# Patient Record
Sex: Female | Born: 1985 | Hispanic: No | Marital: Married | Smoking: Never smoker
Health system: Southern US, Community
[De-identification: ages and names within clinical notes are randomized; demographics above are authoritative.]

## PROBLEM LIST (undated history)

## (undated) HISTORY — PX: DILATION AND CURETTAGE OF UTERUS: SHX78

## (undated) HISTORY — PX: WISDOM TOOTH EXTRACTION: SHX21

---

## 2020-02-07 LAB — OB RESULTS CONSOLE RUBELLA ANTIBODY, IGM: Rubella: IMMUNE

## 2020-02-07 LAB — OB RESULTS CONSOLE HIV ANTIBODY (ROUTINE TESTING): HIV: NONREACTIVE

## 2020-02-07 LAB — HEPATITIS C ANTIBODY: HCV Ab: NEGATIVE

## 2020-02-07 LAB — OB RESULTS CONSOLE HEPATITIS B SURFACE ANTIGEN: Hepatitis B Surface Ag: NEGATIVE

## 2020-02-07 LAB — OB RESULTS CONSOLE RPR: RPR: NONREACTIVE

## 2020-02-15 ENCOUNTER — Inpatient Hospital Stay (HOSPITAL_COMMUNITY): Admit: 2020-02-15 | Payer: 59 | Admitting: Obstetrics & Gynecology

## 2020-03-03 ENCOUNTER — Other Ambulatory Visit: Payer: 59

## 2020-03-03 DIAGNOSIS — Z20822 Contact with and (suspected) exposure to covid-19: Secondary | ICD-10-CM

## 2020-03-05 LAB — SARS-COV-2, NAA 2 DAY TAT

## 2020-03-05 LAB — NOVEL CORONAVIRUS, NAA: SARS-CoV-2, NAA: NOT DETECTED

## 2020-04-24 ENCOUNTER — Other Ambulatory Visit: Payer: Self-pay

## 2020-04-24 ENCOUNTER — Other Ambulatory Visit: Payer: 59

## 2020-04-24 DIAGNOSIS — Z20822 Contact with and (suspected) exposure to covid-19: Secondary | ICD-10-CM

## 2020-04-24 NOTE — Addendum Note (Signed)
Addended by: Tacy Dura on: 04/24/2020 06:50 PM   Modules accepted: Orders

## 2020-04-26 LAB — NOVEL CORONAVIRUS, NAA: SARS-CoV-2, NAA: DETECTED — AB

## 2020-04-26 LAB — SARS-COV-2, NAA 2 DAY TAT

## 2020-08-22 LAB — OB RESULTS CONSOLE GBS: GBS: NEGATIVE

## 2020-09-12 ENCOUNTER — Telehealth (HOSPITAL_COMMUNITY): Payer: Self-pay | Admitting: *Deleted

## 2020-09-12 ENCOUNTER — Encounter (HOSPITAL_COMMUNITY): Payer: Self-pay | Admitting: *Deleted

## 2020-09-12 NOTE — Telephone Encounter (Signed)
Preadmission screen  

## 2020-09-13 ENCOUNTER — Other Ambulatory Visit: Payer: Self-pay | Admitting: Obstetrics and Gynecology

## 2020-09-13 ENCOUNTER — Other Ambulatory Visit (HOSPITAL_COMMUNITY)
Admission: RE | Admit: 2020-09-13 | Discharge: 2020-09-13 | Disposition: A | Discharge status: Home / Self Care | Payer: 59 | Source: Ambulatory Visit | Attending: Obstetrics and Gynecology | Admitting: Obstetrics and Gynecology

## 2020-09-13 DIAGNOSIS — Z01812 Encounter for preprocedural laboratory examination: Secondary | ICD-10-CM | POA: Insufficient documentation

## 2020-09-13 DIAGNOSIS — Z20822 Contact with and (suspected) exposure to covid-19: Secondary | ICD-10-CM | POA: Insufficient documentation

## 2020-09-13 LAB — SARS CORONAVIRUS 2 (TAT 6-24 HRS): SARS Coronavirus 2: NEGATIVE

## 2020-09-14 ENCOUNTER — Inpatient Hospital Stay (HOSPITAL_COMMUNITY): Payer: 59 | Admitting: Anesthesiology

## 2020-09-14 ENCOUNTER — Inpatient Hospital Stay (HOSPITAL_COMMUNITY)
Admission: AD | Admit: 2020-09-14 | Discharge: 2020-09-17 | DRG: 788 | Disposition: A | Discharge status: Home / Self Care | Payer: 59 | Attending: Obstetrics and Gynecology | Admitting: Obstetrics and Gynecology

## 2020-09-14 ENCOUNTER — Inpatient Hospital Stay (HOSPITAL_COMMUNITY): Payer: 59

## 2020-09-14 ENCOUNTER — Inpatient Hospital Stay (HOSPITAL_BASED_OUTPATIENT_CLINIC_OR_DEPARTMENT_OTHER): Payer: 59

## 2020-09-14 ENCOUNTER — Encounter (HOSPITAL_COMMUNITY): Payer: Self-pay | Admitting: Obstetrics and Gynecology

## 2020-09-14 ENCOUNTER — Other Ambulatory Visit: Payer: Self-pay

## 2020-09-14 DIAGNOSIS — Z3A39 39 weeks gestation of pregnancy: Secondary | ICD-10-CM

## 2020-09-14 DIAGNOSIS — O329XX0 Maternal care for malpresentation of fetus, unspecified, not applicable or unspecified: Secondary | ICD-10-CM | POA: Diagnosis not present

## 2020-09-14 DIAGNOSIS — O320XX0 Maternal care for unstable lie, not applicable or unspecified: Secondary | ICD-10-CM | POA: Diagnosis present

## 2020-09-14 DIAGNOSIS — Z20822 Contact with and (suspected) exposure to covid-19: Secondary | ICD-10-CM | POA: Diagnosis present

## 2020-09-14 DIAGNOSIS — O321XX0 Maternal care for breech presentation, not applicable or unspecified: Secondary | ICD-10-CM | POA: Diagnosis present

## 2020-09-14 DIAGNOSIS — Z3689 Encounter for other specified antenatal screening: Secondary | ICD-10-CM | POA: Diagnosis not present

## 2020-09-14 DIAGNOSIS — Z3490 Encounter for supervision of normal pregnancy, unspecified, unspecified trimester: Secondary | ICD-10-CM

## 2020-09-14 LAB — CBC
HCT: 37.4 % (ref 36.0–46.0)
Hemoglobin: 12.6 g/dL (ref 12.0–15.0)
MCH: 30.1 pg (ref 26.0–34.0)
MCHC: 33.7 g/dL (ref 30.0–36.0)
MCV: 89.5 fL (ref 80.0–100.0)
Platelets: 155 10*3/uL (ref 150–400)
RBC: 4.18 MIL/uL (ref 3.87–5.11)
RDW: 14.9 % (ref 11.5–15.5)
WBC: 6.3 10*3/uL (ref 4.0–10.5)
nRBC: 0 % (ref 0.0–0.2)

## 2020-09-14 LAB — RPR: RPR Ser Ql: NONREACTIVE

## 2020-09-14 LAB — TYPE AND SCREEN
ABO/RH(D): O POS
Antibody Screen: NEGATIVE

## 2020-09-14 IMAGING — US US MFM OB LIMITED
1 series · 15 of 19 positions shown · non-contrast
Comparison: none

[Series 1: us mfm ob limited · 15 of 19 slices shown]
[im 1/19]
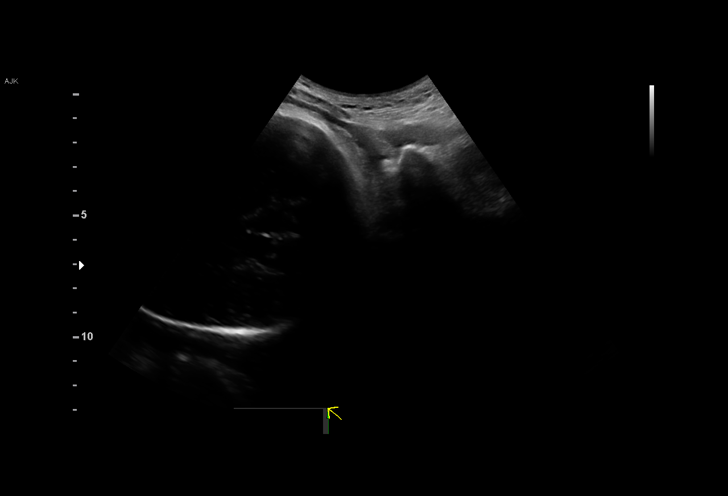
[im 2/19]
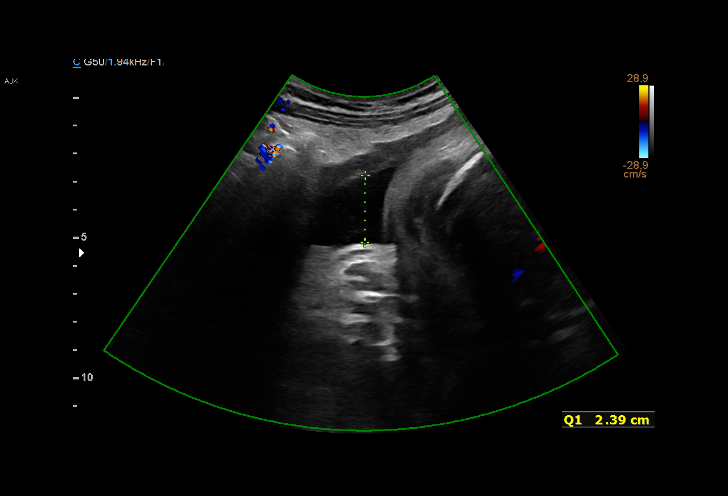
[im 4/19]
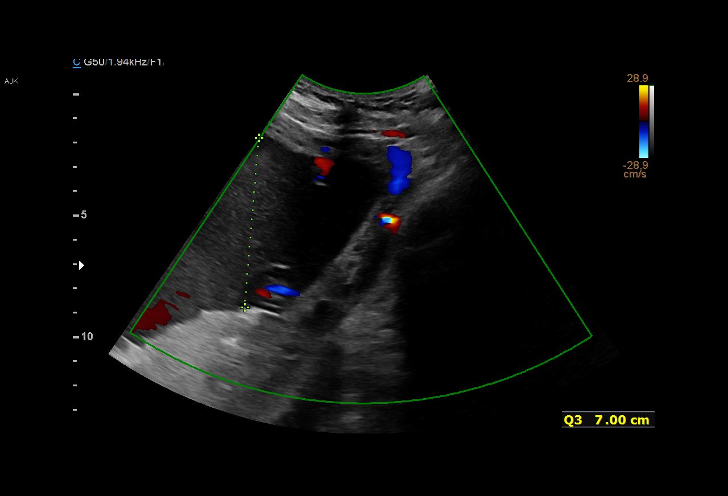
[im 5/19]
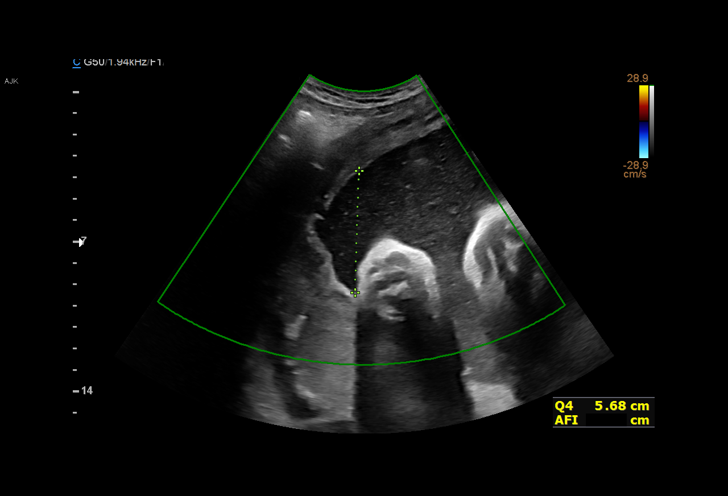
[im 6/19]
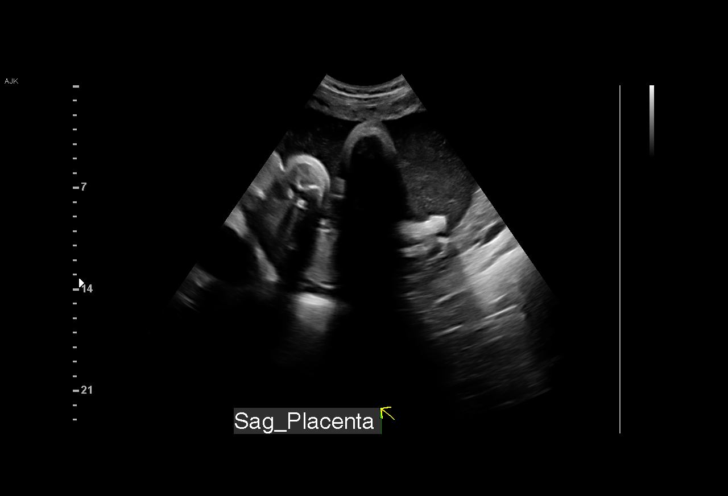
[im 7/19]
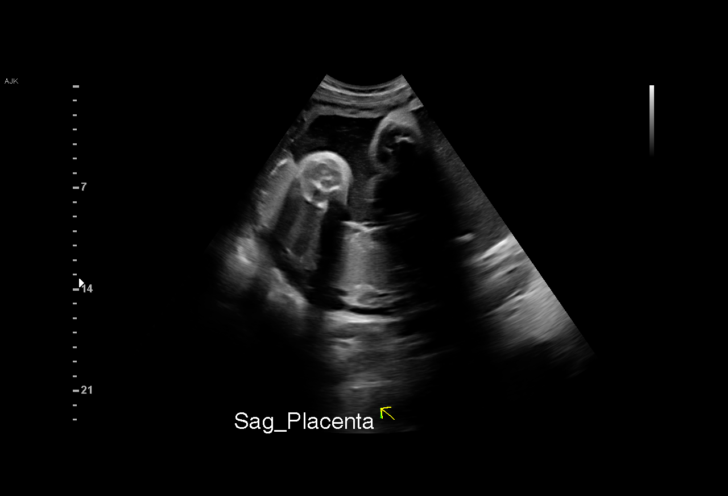
[im 9/19]
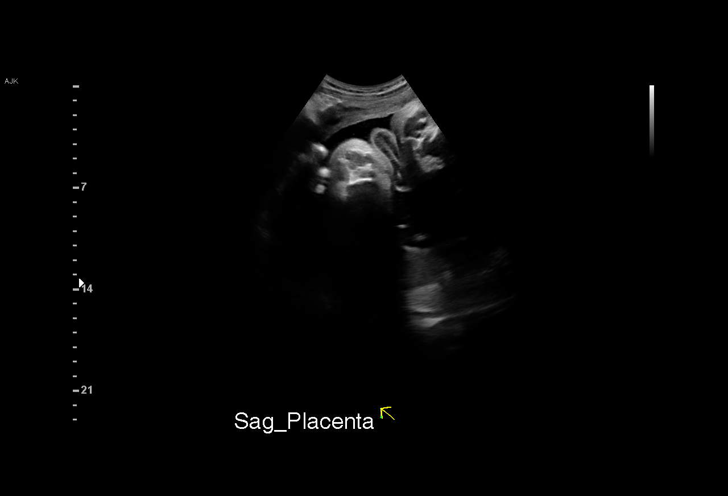
[im 10/19]
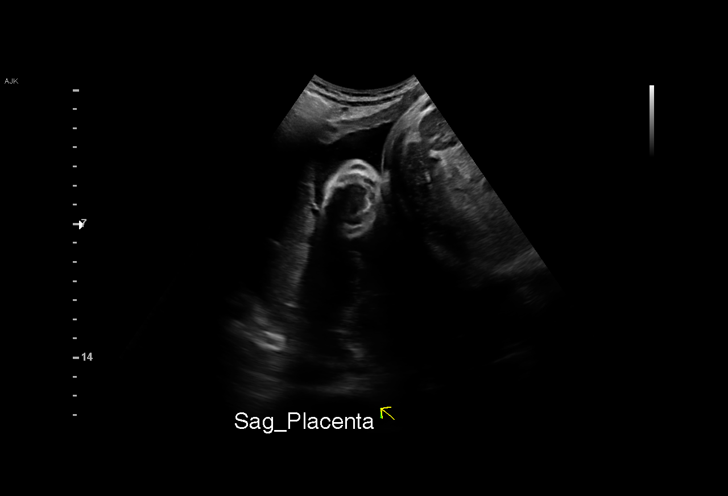
[im 11/19]
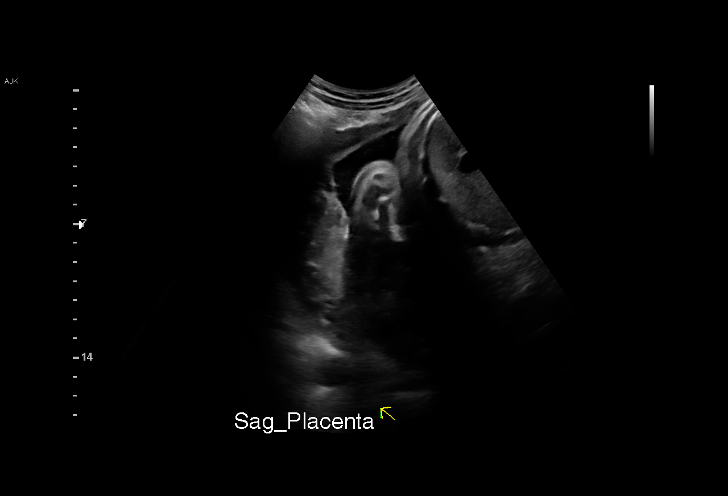
[im 13/19]
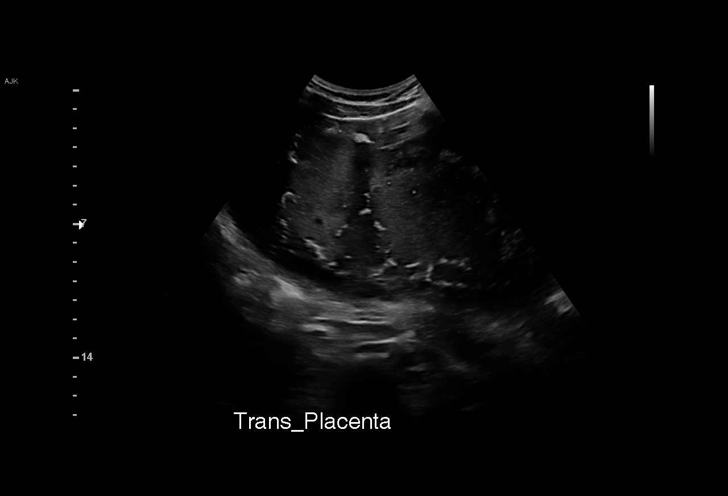
[im 14/19]
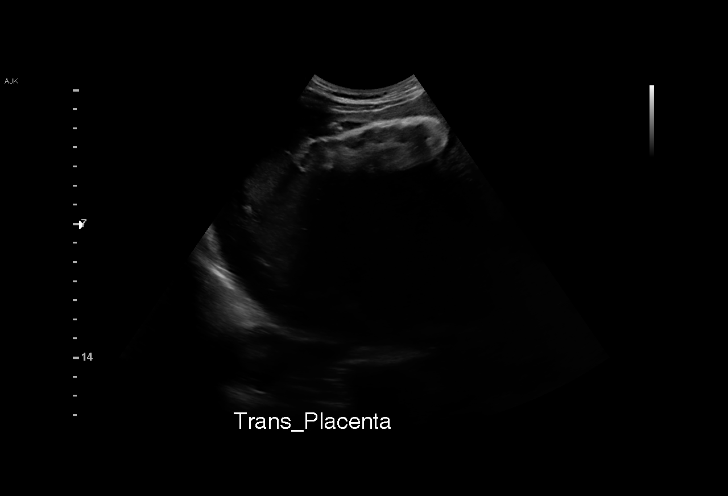
[im 15/19]
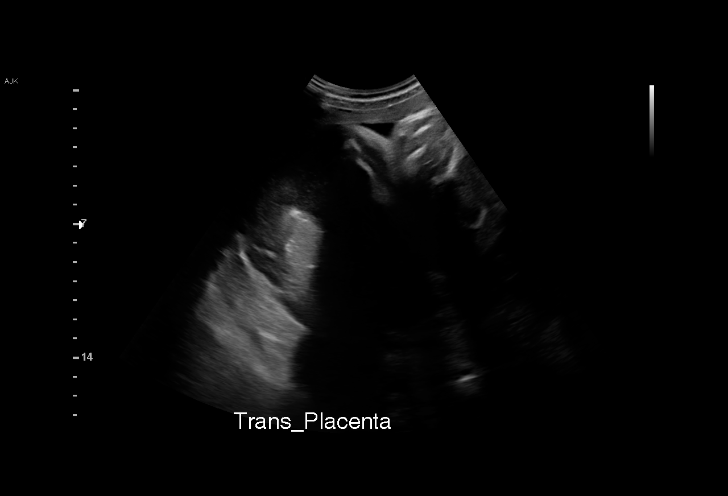
[im 16/19]
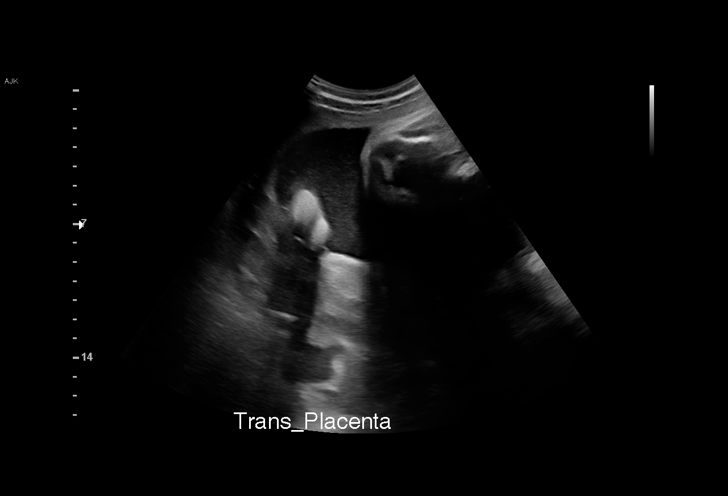
[im 18/19]
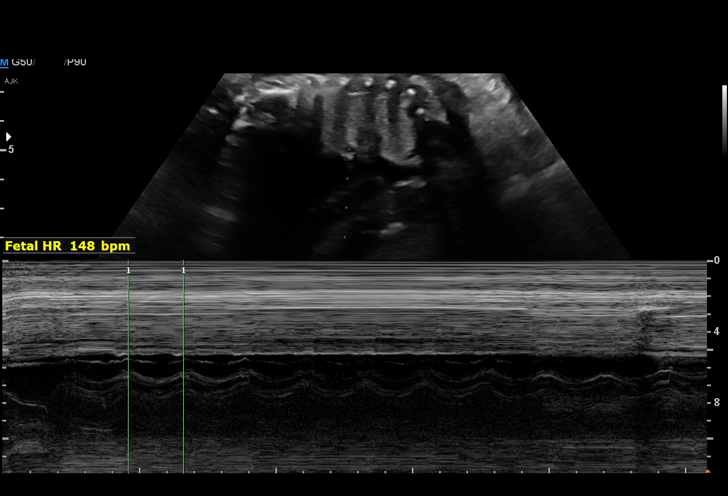
[im 19/19]
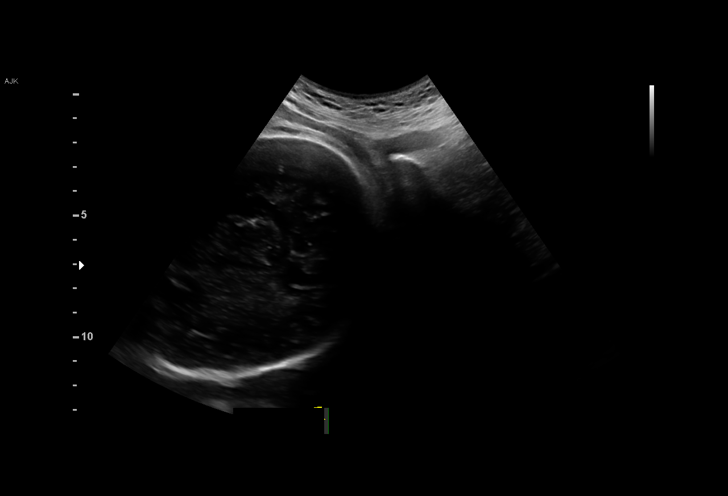

[15 of 19 positions shown; findings below may reference images not displayed]

[REDACTED]
                                                            Ave[address] [HOSPITAL]

 1  US MFM OB LIMITED                     [address].01    HUERTA

Indications

 39 weeks gestation of pregnancy
 Determine Fetal presentation by ultrasound     [EY]
Fetal Evaluation

 Num Of Fetuses:         1
 Fetal Heart Rate(bpm):  148
 Cardiac Activity:       Observed
 Presentation:           Cephalic
 Placenta:               Posterior
 P. Cord Insertion:      Not well visualized

 Amniotic Fluid
 AFI FV:      Within normal limits

 AFI Sum(cm)     %Tile       Largest Pocket(cm)
 18.5            78          7

 RUQ(cm)       RLQ(cm)       LUQ(cm)        LLQ(cm)
 2.4           5.7           3.4            7
Gestational Age

 Clinical EDD:  39w 3d                                        EDD:   [DATE]
 Best:          39w 3d     Det. By:  Clinical EDD             EDD:   [DATE]
Impression

 Ultrasound was requested to determine fetal presentation.
 A limited ultrasound study was performed.  Cephalic
 presentation.  Amniotic fluid is normal and good fetal activity
 seen.
                 HUERTA

## 2020-09-14 MED ORDER — OXYCODONE-ACETAMINOPHEN 5-325 MG PO TABS: 1.0000 | ORAL_TABLET | ORAL | Status: DC | PRN

## 2020-09-14 MED ORDER — LIDOCAINE HCL (PF) 1 % IJ SOLN
INTRAMUSCULAR | Status: DC | PRN
  Administered 2020-09-14: 8 mL via EPIDURAL
  Administered 2020-09-14: 5 mL via EPIDURAL

## 2020-09-14 MED ORDER — DIPHENHYDRAMINE HCL 50 MG/ML IJ SOLN: 12.5000 mg | INTRAMUSCULAR | Status: DC | PRN

## 2020-09-14 MED ORDER — LACTATED RINGERS IV SOLN
500.0000 mL | Freq: Once | INTRAVENOUS | Status: AC
  Administered 2020-09-14: 500 mL via INTRAVENOUS

## 2020-09-14 MED ORDER — EPHEDRINE 5 MG/ML INJ
10.0000 mg | INTRAVENOUS | Status: AC | PRN
  Administered 2020-09-14 (×2): 10 mg via INTRAVENOUS

## 2020-09-14 MED ORDER — FENTANYL-BUPIVACAINE-NACL 0.5-0.125-0.9 MG/250ML-% EP SOLN
12.0000 mL/h | EPIDURAL | Status: DC | PRN
  Administered 2020-09-14: 12 mL/h via EPIDURAL
  Filled 2020-09-14: qty 250

## 2020-09-14 MED ORDER — ONDANSETRON HCL 4 MG/2ML IJ SOLN: 4.0000 mg | Freq: Four times a day (QID) | INTRAMUSCULAR | Status: DC | PRN

## 2020-09-14 MED ORDER — OXYTOCIN-SODIUM CHLORIDE 30-0.9 UT/500ML-% IV SOLN
2.5000 [IU]/h | INTRAVENOUS | Status: DC
  Administered 2020-09-15: 400 m[IU]/min via INTRAVENOUS

## 2020-09-14 MED ORDER — LACTATED RINGERS IV SOLN
500.0000 mL | INTRAVENOUS | Status: DC | PRN
  Administered 2020-09-14: 500 mL via INTRAVENOUS
  Administered 2020-09-14: 800 mL via INTRAVENOUS
  Administered 2020-09-15 (×2): 500 mL via INTRAVENOUS

## 2020-09-14 MED ORDER — OXYCODONE-ACETAMINOPHEN 5-325 MG PO TABS: 2.0000 | ORAL_TABLET | ORAL | Status: DC | PRN

## 2020-09-14 MED ORDER — OXYTOCIN BOLUS FROM INFUSION: 333.0000 mL | Freq: Once | INTRAVENOUS | Status: DC

## 2020-09-14 MED ORDER — PHENYLEPHRINE 40 MCG/ML (10ML) SYRINGE FOR IV PUSH (FOR BLOOD PRESSURE SUPPORT)
80.0000 ug | PREFILLED_SYRINGE | INTRAVENOUS | Status: AC | PRN
  Administered 2020-09-14 (×3): 80 ug via INTRAVENOUS
  Filled 2020-09-14 (×2): qty 10

## 2020-09-14 MED ORDER — PHENYLEPHRINE 40 MCG/ML (10ML) SYRINGE FOR IV PUSH (FOR BLOOD PRESSURE SUPPORT)
80.0000 ug | PREFILLED_SYRINGE | INTRAVENOUS | Status: AC | PRN
  Administered 2020-09-14 (×3): 80 ug via INTRAVENOUS

## 2020-09-14 MED ORDER — PHENYLEPHRINE 40 MCG/ML (10ML) SYRINGE FOR IV PUSH (FOR BLOOD PRESSURE SUPPORT)
80.0000 ug | PREFILLED_SYRINGE | INTRAVENOUS | Status: DC | PRN
  Administered 2020-09-14 (×2): 80 ug via INTRAVENOUS

## 2020-09-14 MED ORDER — LACTATED RINGERS AMNIOINFUSION: INTRAVENOUS | Status: DC

## 2020-09-14 MED ORDER — FENTANYL CITRATE (PF) 100 MCG/2ML IJ SOLN
50.0000 ug | INTRAMUSCULAR | Status: DC | PRN
  Filled 2020-09-14: qty 2

## 2020-09-14 MED ORDER — LACTATED RINGERS IV SOLN: INTRAVENOUS | Status: DC

## 2020-09-14 MED ORDER — EPHEDRINE 5 MG/ML INJ
10.0000 mg | INTRAVENOUS | Status: DC | PRN
  Filled 2020-09-14: qty 10

## 2020-09-14 MED ORDER — TERBUTALINE SULFATE 1 MG/ML IJ SOLN: 0.2500 mg | Freq: Once | INTRAMUSCULAR | Status: DC

## 2020-09-14 MED ORDER — TERBUTALINE SULFATE 1 MG/ML IJ SOLN
0.2500 mg | Freq: Once | INTRAMUSCULAR | Status: AC | PRN
  Administered 2020-09-14: 0.25 mg via SUBCUTANEOUS
  Filled 2020-09-14: qty 1

## 2020-09-14 MED ORDER — MINERAL OIL LIGHT OIL
TOPICAL_OIL | Freq: Once | Status: AC
  Administered 2020-09-14: 1 via TOPICAL
  Filled 2020-09-14 (×2): qty 10

## 2020-09-14 MED ORDER — SOD CITRATE-CITRIC ACID 500-334 MG/5ML PO SOLN
30.0000 mL | ORAL | Status: DC | PRN
  Administered 2020-09-15: 30 mL via ORAL
  Filled 2020-09-14: qty 15

## 2020-09-14 MED ORDER — LIDOCAINE HCL (PF) 1 % IJ SOLN: 30.0000 mL | INTRAMUSCULAR | Status: DC | PRN

## 2020-09-14 MED ORDER — OXYTOCIN-SODIUM CHLORIDE 30-0.9 UT/500ML-% IV SOLN
1.0000 m[IU]/min | INTRAVENOUS | Status: DC
  Administered 2020-09-14: 24 m[IU]/min via INTRAVENOUS
  Administered 2020-09-14: 2 m[IU]/min via INTRAVENOUS
  Administered 2020-09-15: 22 m[IU]/min via INTRAVENOUS
  Administered 2020-09-15: 28 m[IU]/min via INTRAVENOUS
  Administered 2020-09-15: 24 m[IU]/min via INTRAVENOUS
  Administered 2020-09-15: 28 m[IU]/min via INTRAVENOUS
  Administered 2020-09-15: 26 m[IU]/min via INTRAVENOUS
  Filled 2020-09-14 (×2): qty 500

## 2020-09-14 MED ORDER — FENTANYL-BUPIVACAINE-NACL 0.5-0.125-0.9 MG/250ML-% EP SOLN
EPIDURAL | Status: AC
  Filled 2020-09-14: qty 250

## 2020-09-14 MED ORDER — ACETAMINOPHEN 325 MG PO TABS: 650.0000 mg | ORAL_TABLET | ORAL | Status: DC | PRN

## 2020-09-14 NOTE — Anesthesia Procedure Notes (Signed)
Epidural Patient location during procedure: OB Start time: 09/14/2020 9:50 AM End time: 09/14/2020 10:01 AM  Staffing Anesthesiologist: Mellody Dance, MD Performed: anesthesiologist   Preanesthetic Checklist Completed: patient identified, IV checked, site marked, risks and benefits discussed, monitors and equipment checked, pre-op evaluation and timeout performed  Epidural Patient position: sitting Prep: DuraPrep Patient monitoring: heart rate, cardiac monitor, continuous pulse ox and blood pressure Approach: midline Location: L2-L3 Injection technique: LOR saline  Needle:  Needle type: Tuohy  Needle gauge: 17 G Needle length: 9 cm Needle insertion depth: 5 cm Catheter type: closed end flexible Catheter size: 20 Guage Catheter at skin depth: 10 cm Test dose: negative and Other  Assessment Events: blood not aspirated, injection not painful, no injection resistance and negative IV test  Additional Notes Informed consent obtained prior to proceeding including risk of failure, 1% risk of PDPH, risk of minor discomfort and bruising.  Discussed rare but serious complications including epidural abscess, permanent nerve injury, epidural hematoma.  Discussed alternatives to epidural analgesia and patient desires to proceed.  Timeout performed pre-procedure verifying patient name, procedure, and platelet count.  Patient tolerated procedure well.

## 2020-09-14 NOTE — Progress Notes (Signed)
External version cancelled. As baby is now cephalic by ultrasound. Pt is advised that baby has an unstable lie and could change positions again.  Will start pitocin 2x2 plan AROM with fetal descent and increased dilation.

## 2020-09-14 NOTE — Progress Notes (Signed)
   Subjective: Pt does not feel contractions. +FM no vaginal bleeding no LOF   Objective: BP 97/62   Pulse (!) 126   Resp 20   Ht 5\' 2"  (1.575 m)   Wt 85 kg   SpO2 100%   BMI 34.28 kg/m  No intake/output data recorded. No intake/output data recorded.  FHT:  FHR: 145 bpm, variability: moderate,  accelerations:  Present,  decelerations:  Absent UC:   regular, every 2-3 minutes SVE:   Dilation: 2 Effacement (%): 70,80 Station: -3 Exam by:: Dr. [email or web address]  Labs: Lab Results  Component Value Date   WBC 6.3 09/14/2020   HGB 12.6 09/14/2020   HCT 37.4 09/14/2020   MCV 89.5 09/14/2020   PLT 155 09/14/2020    Assessment / Plan: Induction of labor due to term with favorable cervix,  progressing well on pitocin  Labor: progressing on pitocin  Preeclampsia:  NA Fetal Wellbeing:  Category I Pain Control:  Epidural I/D:  n/a Anticipated MOD:  NSVD  11/14/2020 09/14/2020, 4:23 PM

## 2020-09-14 NOTE — Progress Notes (Signed)
   Subjective:  pt rates contractions as 3 out of 10   Objective: BP 105/64   Pulse 96   Temp 98.4 F (36.9 C) (Axillary)   Resp 20   Ht 5\' 2"  (1.575 m)   Wt 85 kg   SpO2 100%   BMI 34.28 kg/m  I/O last 3 completed shifts: In: -  Out: 1000 [Urine:1000] No intake/output data recorded.  FHT:  FHR: 140 bpm, variability: moderate,  accelerations:  Present,  decelerations:  Present variable  UC:   regular, every 3-4 minutes mvu 55 on 22 of pitocin  SVE:   Dilation: 4 Effacement (%): 70 Station: -2 Exam by:: Dr. [email or web address]  Labs: Lab Results  Component Value Date   WBC 6.3 09/14/2020   HGB 12.6 09/14/2020   HCT 37.4 09/14/2020   MCV 89.5 09/14/2020   PLT 155 09/14/2020    Assessment / Plan: Protracted latent phase  Labor: Continue pitocin increase as tolerated 2x2 to reach mvu 200  Preeclampsia:  NA Fetal Wellbeing:  Category I and Category II start amniofusion 250 cc bolus and 100 cc an hour due to variable decelerations  Pain Control:  Labor support without medications I/D:  n/a Anticipated MOD:  NSVD  11/14/2020 09/14/2020, 7:06 PM

## 2020-09-14 NOTE — H&P (Signed)
Reba Pederson is a 35 y.o. female presenting for induction of labor at 39 weeks and 3 days. On arrival presenting part is no longer vertex. Breech presentation. Pregnancy has been otherwise uncomplicated. Prenatal care provided by Dr. Richardson Dopp   Placenta is located posteriorly   . OB History    Gravida  5   Para  3   Term  3   Preterm      AB  1   Living  3     SAB  1   IAB      Ectopic      Multiple      Live Births             History reviewed. No pertinent past medical history. Past Surgical History:  Procedure Laterality Date  . DILATION AND CURETTAGE OF UTERUS    . WISDOM TOOTH EXTRACTION     Family History: family history includes Hypertension in her mother. Social History:  reports that she has never smoked. She has never used smokeless tobacco. No history on file for alcohol use and drug use.     Maternal Diabetes: No Genetic Screening: Declined Maternal Ultrasounds/Referrals: Normal Fetal Ultrasounds or other Referrals:  None Maternal Substance Abuse:  No Significant Maternal Medications:  None Significant Maternal Lab Results:  Group B Strep negative Other Comments:  None  Review of Systems  Constitutional: Negative.   HENT: Negative.   Eyes: Negative.   Respiratory: Negative.   Cardiovascular: Negative.   Gastrointestinal: Negative.   Endocrine: Negative.   Genitourinary: Negative.   Musculoskeletal: Negative.   Skin: Negative.   Allergic/Immunologic: Negative.   Neurological: Negative.   Hematological: Negative.   Psychiatric/Behavioral: Negative.    History Dilation: 1 Effacement (%): 50 Station: Ballotable Exam by:: Henderson Newcomer, RN Blood pressure 109/73, pulse 85, resp. rate 20, height 5\' 2"  (1.575 m), weight 85 kg. Maternal Exam:  Introitus: Normal vulva.   Physical Exam Vitals reviewed.  Constitutional:      Appearance: Normal appearance.  HENT:     Head: Normocephalic and atraumatic.  Eyes:     Pupils: Pupils  are equal, round, and reactive to light.  Cardiovascular:     Rate and Rhythm: Normal rate and regular rhythm.     Pulses: Normal pulses.     Heart sounds: Normal heart sounds.  Pulmonary:     Effort: Pulmonary effort is normal.     Breath sounds: Normal breath sounds.  Genitourinary:    General: Normal vulva.  Musculoskeletal:        General: Swelling present. Normal range of motion.     Cervical back: Normal range of motion and neck supple.  Skin:    General: Skin is warm and dry.  Neurological:     General: No focal deficit present.     Mental Status: She is alert and oriented to person, place, and time.  Psychiatric:        Mood and Affect: Mood normal.        Behavior: Behavior normal.     Prenatal labs: ABO, Rh: --/--/O POS (06/02 0750) Antibody: NEG (06/02 0750) Rubella:  immune RPR:   Nonreactive  HBsAg:   Negative  HIV:   Negative GBS:   Negative   Assessment/Plan: 39 weeks and 3 days admitted for induction however presentation is no longer cephalic  Breech presentation discussed with patient options of external version vs cesarean section. R/o external version discussed including but not limited to abruption with need for  emergent cesarean section and possible fetal and maternal morbidity and mortality / 50 % failure rate discussed. If version is unsuccessful cesarean section would be recommended. R/o cesarean section discussed including but not limited to infection, bleeding, damage to bowel bladder baby with the need for further surgery   Pt desires to try external version.  - plan epidural for pain control  - terbutaline for tocolysis      Gerald Leitz 09/14/2020, 9:47 AM

## 2020-09-14 NOTE — Anesthesia Preprocedure Evaluation (Addendum)
Anesthesia Evaluation  Patient identified by MRN, date of birth, ID band Patient awake    Reviewed: Allergy & Precautions, NPO status , Patient's Chart, lab work & pertinent test results  Airway Mallampati: II  TM Distance: >3 FB Neck ROM: Full    Dental no notable dental hx.    Pulmonary neg pulmonary ROS,    Pulmonary exam normal breath sounds clear to auscultation       Cardiovascular negative cardio ROS Normal cardiovascular exam Rhythm:Regular Rate:Normal     Neuro/Psych negative neurological ROS  negative psych ROS   GI/Hepatic negative GI ROS, Neg liver ROS,   Endo/Other  negative endocrine ROS  Renal/GU negative Renal ROS  negative genitourinary   Musculoskeletal negative musculoskeletal ROS (+)   Abdominal   Peds negative pediatric ROS (+)  Hematology negative hematology ROS (+)   Anesthesia Other Findings   Reproductive/Obstetrics negative OB ROS                             Anesthesia Physical Anesthesia Plan  ASA: II  Anesthesia Plan: Epidural   Post-op Pain Management:    Induction:   PONV Risk Score and Plan: 2  Airway Management Planned: Natural Airway  Additional Equipment:   Intra-op Plan:   Post-operative Plan:   Informed Consent: I have reviewed the patients History and Physical, chart, labs and discussed the procedure including the risks, benefits and alternatives for the proposed anesthesia with the patient or authorized representative who has indicated his/her understanding and acceptance.       Plan Discussed with: Anesthesiologist  Anesthesia Plan Comments: (To OR for C section due to failure to progress. Labor epidural in place and functioning well. )       Anesthesia Quick Evaluation

## 2020-09-15 ENCOUNTER — Encounter (HOSPITAL_COMMUNITY)
Admission: AD | Disposition: A | Discharge status: Home / Self Care | Payer: Self-pay | Source: Home / Self Care | Attending: Obstetrics and Gynecology

## 2020-09-15 ENCOUNTER — Encounter (HOSPITAL_COMMUNITY): Payer: Self-pay | Admitting: Obstetrics and Gynecology

## 2020-09-15 SURGERY — Surgical Case
Anesthesia: Epidural

## 2020-09-15 MED ORDER — DEXMEDETOMIDINE (PRECEDEX) IN NS 20 MCG/5ML (4 MCG/ML) IV SYRINGE
PREFILLED_SYRINGE | INTRAVENOUS | Status: AC
  Filled 2020-09-15: qty 15

## 2020-09-15 MED ORDER — OXYCODONE HCL 5 MG PO TABS: 5.0000 mg | ORAL_TABLET | Freq: Once | ORAL | Status: DC | PRN

## 2020-09-15 MED ORDER — DIPHENHYDRAMINE HCL 50 MG/ML IJ SOLN: 12.5000 mg | INTRAMUSCULAR | Status: DC | PRN

## 2020-09-15 MED ORDER — DIPHENHYDRAMINE HCL 25 MG PO CAPS: 25.0000 mg | ORAL_CAPSULE | ORAL | Status: DC | PRN

## 2020-09-15 MED ORDER — DEXAMETHASONE SODIUM PHOSPHATE 10 MG/ML IJ SOLN
INTRAMUSCULAR | Status: DC | PRN
  Administered 2020-09-15: 10 mg via INTRAVENOUS

## 2020-09-15 MED ORDER — KETOROLAC TROMETHAMINE 30 MG/ML IJ SOLN
30.0000 mg | Freq: Four times a day (QID) | INTRAMUSCULAR | Status: AC
  Administered 2020-09-15 – 2020-09-16 (×3): 30 mg via INTRAVENOUS
  Filled 2020-09-15 (×3): qty 1

## 2020-09-15 MED ORDER — EPHEDRINE 5 MG/ML INJ
INTRAVENOUS | Status: AC
  Filled 2020-09-15: qty 10

## 2020-09-15 MED ORDER — FENTANYL CITRATE (PF) 100 MCG/2ML IJ SOLN
INTRAMUSCULAR | Status: DC | PRN
  Administered 2020-09-15 (×2): 100 ug via INTRAVENOUS

## 2020-09-15 MED ORDER — METHYLERGONOVINE MALEATE 0.2 MG/ML IJ SOLN
INTRAMUSCULAR | Status: DC | PRN
  Administered 2020-09-15: .2 mg via INTRAMUSCULAR

## 2020-09-15 MED ORDER — ACETAMINOPHEN 10 MG/ML IV SOLN
1000.0000 mg | Freq: Once | INTRAVENOUS | Status: DC | PRN
  Administered 2020-09-15: 1000 mg via INTRAVENOUS

## 2020-09-15 MED ORDER — FENTANYL CITRATE (PF) 100 MCG/2ML IJ SOLN
INTRAMUSCULAR | Status: AC
  Filled 2020-09-15: qty 2

## 2020-09-15 MED ORDER — KETOROLAC TROMETHAMINE 30 MG/ML IJ SOLN: 30.0000 mg | Freq: Four times a day (QID) | INTRAMUSCULAR | Status: AC | PRN

## 2020-09-15 MED ORDER — ZOLPIDEM TARTRATE 5 MG PO TABS
5.0000 mg | ORAL_TABLET | Freq: Every evening | ORAL | Status: DC | PRN
Start: 2020-09-15 — End: 2020-09-17

## 2020-09-15 MED ORDER — SODIUM CHLORIDE 0.9 % IV SOLN
500.0000 mg | INTRAVENOUS | Status: AC
  Administered 2020-09-15: 500 mg via INTRAVENOUS

## 2020-09-15 MED ORDER — OXYCODONE HCL 5 MG PO TABS: 5.0000 mg | ORAL_TABLET | ORAL | Status: DC | PRN

## 2020-09-15 MED ORDER — ACETAMINOPHEN 10 MG/ML IV SOLN
INTRAVENOUS | Status: AC
  Filled 2020-09-15: qty 100

## 2020-09-15 MED ORDER — MORPHINE SULFATE (PF) 0.5 MG/ML IJ SOLN
INTRAMUSCULAR | Status: DC | PRN
  Administered 2020-09-15: 3 mg via EPIDURAL

## 2020-09-15 MED ORDER — LACTATED RINGERS IV SOLN: INTRAVENOUS | Status: DC

## 2020-09-15 MED ORDER — IBUPROFEN 600 MG PO TABS
600.0000 mg | ORAL_TABLET | Freq: Four times a day (QID) | ORAL | Status: DC
  Administered 2020-09-16 – 2020-09-17 (×4): 600 mg via ORAL
  Filled 2020-09-15 (×4): qty 1

## 2020-09-15 MED ORDER — NALBUPHINE HCL 10 MG/ML IJ SOLN: 5.0000 mg | INTRAMUSCULAR | Status: DC | PRN

## 2020-09-15 MED ORDER — OXYTOCIN-SODIUM CHLORIDE 30-0.9 UT/500ML-% IV SOLN
INTRAVENOUS | Status: AC
  Filled 2020-09-15: qty 500

## 2020-09-15 MED ORDER — MEPERIDINE HCL 25 MG/ML IJ SOLN: 6.2500 mg | INTRAMUSCULAR | Status: DC | PRN

## 2020-09-15 MED ORDER — ONDANSETRON HCL 4 MG/2ML IJ SOLN
INTRAMUSCULAR | Status: DC | PRN
  Administered 2020-09-15: 4 mg via INTRAVENOUS

## 2020-09-15 MED ORDER — ACETAMINOPHEN 500 MG PO TABS
1000.0000 mg | ORAL_TABLET | Freq: Four times a day (QID) | ORAL | Status: DC | PRN
  Administered 2020-09-16 – 2020-09-17 (×3): 1000 mg via ORAL
  Filled 2020-09-15 (×5): qty 2

## 2020-09-15 MED ORDER — NALBUPHINE HCL 10 MG/ML IJ SOLN: 5.0000 mg | Freq: Once | INTRAMUSCULAR | Status: DC | PRN

## 2020-09-15 MED ORDER — SENNOSIDES-DOCUSATE SODIUM 8.6-50 MG PO TABS
2.0000 | ORAL_TABLET | Freq: Every day | ORAL | Status: DC
  Administered 2020-09-16 – 2020-09-17 (×2): 2 via ORAL
  Filled 2020-09-15 (×2): qty 2

## 2020-09-15 MED ORDER — SIMETHICONE 80 MG PO CHEW: 80.0000 mg | CHEWABLE_TABLET | ORAL | Status: DC | PRN

## 2020-09-15 MED ORDER — KETOROLAC TROMETHAMINE 30 MG/ML IJ SOLN
INTRAMUSCULAR | Status: AC
  Filled 2020-09-15: qty 1

## 2020-09-15 MED ORDER — SODIUM BICARBONATE 8.4 % IV SOLN
INTRAVENOUS | Status: DC | PRN
  Administered 2020-09-15: 10 mL via EPIDURAL
  Administered 2020-09-15 (×2): 5 mL via EPIDURAL

## 2020-09-15 MED ORDER — FENTANYL CITRATE (PF) 100 MCG/2ML IJ SOLN
INTRAMUSCULAR | Status: DC | PRN
  Administered 2020-09-15: 100 ug via EPIDURAL

## 2020-09-15 MED ORDER — SODIUM CHLORIDE 0.9 % IV SOLN
INTRAVENOUS | Status: AC
  Filled 2020-09-15: qty 500

## 2020-09-15 MED ORDER — DIPHENHYDRAMINE HCL 50 MG/ML IJ SOLN
25.0000 mg | Freq: Once | INTRAMUSCULAR | Status: AC
  Administered 2020-09-15: 25 mg via INTRAVENOUS

## 2020-09-15 MED ORDER — KETOROLAC TROMETHAMINE 30 MG/ML IJ SOLN
30.0000 mg | Freq: Four times a day (QID) | INTRAMUSCULAR | Status: AC | PRN
  Administered 2020-09-15: 30 mg via INTRAVENOUS

## 2020-09-15 MED ORDER — DIPHENHYDRAMINE HCL 25 MG PO CAPS: 25.0000 mg | ORAL_CAPSULE | Freq: Four times a day (QID) | ORAL | Status: DC | PRN

## 2020-09-15 MED ORDER — DIBUCAINE (PERIANAL) 1 % EX OINT: 1.0000 "application " | TOPICAL_OINTMENT | CUTANEOUS | Status: DC | PRN

## 2020-09-15 MED ORDER — MENTHOL 3 MG MT LOZG: 1.0000 | LOZENGE | OROMUCOSAL | Status: DC | PRN

## 2020-09-15 MED ORDER — SODIUM CHLORIDE 0.9% FLUSH: 3.0000 mL | INTRAVENOUS | Status: DC | PRN

## 2020-09-15 MED ORDER — WITCH HAZEL-GLYCERIN EX PADS
1.0000 "application " | MEDICATED_PAD | CUTANEOUS | Status: DC | PRN
  Administered 2020-09-15: 1 via TOPICAL

## 2020-09-15 MED ORDER — HYDROMORPHONE HCL 1 MG/ML IJ SOLN
0.2500 mg | INTRAMUSCULAR | Status: DC | PRN
  Administered 2020-09-15: 0.5 mg via INTRAVENOUS

## 2020-09-15 MED ORDER — DEXMEDETOMIDINE (PRECEDEX) IN NS 20 MCG/5ML (4 MCG/ML) IV SYRINGE
PREFILLED_SYRINGE | INTRAVENOUS | Status: DC | PRN
  Administered 2020-09-15 (×3): 20 ug via INTRAVENOUS

## 2020-09-15 MED ORDER — PROMETHAZINE HCL 25 MG/ML IJ SOLN: 6.2500 mg | INTRAMUSCULAR | Status: DC | PRN

## 2020-09-15 MED ORDER — OXYCODONE HCL 5 MG/5ML PO SOLN: 5.0000 mg | Freq: Once | ORAL | Status: DC | PRN

## 2020-09-15 MED ORDER — OXYTOCIN-SODIUM CHLORIDE 30-0.9 UT/500ML-% IV SOLN: 2.5000 [IU]/h | INTRAVENOUS | Status: AC

## 2020-09-15 MED ORDER — SIMETHICONE 80 MG PO CHEW
80.0000 mg | CHEWABLE_TABLET | Freq: Three times a day (TID) | ORAL | Status: DC
  Administered 2020-09-16 – 2020-09-17 (×3): 80 mg via ORAL
  Filled 2020-09-15 (×3): qty 1

## 2020-09-15 MED ORDER — ONDANSETRON HCL 4 MG/2ML IJ SOLN: 4.0000 mg | Freq: Three times a day (TID) | INTRAMUSCULAR | Status: DC | PRN

## 2020-09-15 MED ORDER — NALOXONE HCL 0.4 MG/ML IJ SOLN: 0.4000 mg | INTRAMUSCULAR | Status: DC | PRN

## 2020-09-15 MED ORDER — DIPHENHYDRAMINE HCL 50 MG/ML IJ SOLN
INTRAMUSCULAR | Status: AC
  Filled 2020-09-15: qty 1

## 2020-09-15 MED ORDER — NALOXONE HCL 4 MG/10ML IJ SOLN
1.0000 ug/kg/h | INTRAVENOUS | Status: DC | PRN
  Filled 2020-09-15: qty 5

## 2020-09-15 MED ORDER — SCOPOLAMINE 1 MG/3DAYS TD PT72
1.0000 | MEDICATED_PATCH | Freq: Once | TRANSDERMAL | Status: DC
  Administered 2020-09-15: 1.5 mg via TRANSDERMAL

## 2020-09-15 MED ORDER — HYDROMORPHONE HCL 1 MG/ML IJ SOLN
INTRAMUSCULAR | Status: AC
  Filled 2020-09-15: qty 0.5

## 2020-09-15 MED ORDER — ONDANSETRON HCL 4 MG/2ML IJ SOLN
INTRAMUSCULAR | Status: AC
  Filled 2020-09-15: qty 2

## 2020-09-15 MED ORDER — MORPHINE SULFATE (PF) 2 MG/ML IV SOLN: 1.0000 mg | INTRAVENOUS | Status: DC | PRN

## 2020-09-15 MED ORDER — DEXAMETHASONE SODIUM PHOSPHATE 10 MG/ML IJ SOLN
INTRAMUSCULAR | Status: AC
  Filled 2020-09-15: qty 1

## 2020-09-15 MED ORDER — PRENATAL MULTIVITAMIN CH
1.0000 | ORAL_TABLET | Freq: Every day | ORAL | Status: DC
  Administered 2020-09-16 – 2020-09-17 (×2): 1 via ORAL
  Filled 2020-09-15: qty 1

## 2020-09-15 MED ORDER — NALBUPHINE HCL 10 MG/ML IJ SOLN
5.0000 mg | INTRAMUSCULAR | Status: DC | PRN
Start: 2020-09-15 — End: 2020-09-17

## 2020-09-15 MED ORDER — SCOPOLAMINE 1 MG/3DAYS TD PT72
MEDICATED_PATCH | TRANSDERMAL | Status: AC
  Filled 2020-09-15: qty 1

## 2020-09-15 MED ORDER — CEFAZOLIN SODIUM-DEXTROSE 2-4 GM/100ML-% IV SOLN
2.0000 g | INTRAVENOUS | Status: AC
  Administered 2020-09-15: 2 g via INTRAVENOUS

## 2020-09-15 MED ORDER — EPHEDRINE SULFATE 50 MG/ML IJ SOLN
INTRAMUSCULAR | Status: DC | PRN
  Administered 2020-09-15 (×2): 10 mg via INTRAVENOUS

## 2020-09-15 MED ORDER — BUPIVACAINE HCL (PF) 0.25 % IJ SOLN
INTRAMUSCULAR | Status: DC | PRN
  Administered 2020-09-15: 8 mL via EPIDURAL

## 2020-09-15 MED ORDER — COCONUT OIL OIL: 1.0000 "application " | TOPICAL_OIL | Status: DC | PRN

## 2020-09-15 MED ORDER — PHENYLEPHRINE 40 MCG/ML (10ML) SYRINGE FOR IV PUSH (FOR BLOOD PRESSURE SUPPORT)
PREFILLED_SYRINGE | INTRAVENOUS | Status: AC
  Filled 2020-09-15: qty 10

## 2020-09-15 MED ORDER — MORPHINE SULFATE (PF) 0.5 MG/ML IJ SOLN
INTRAMUSCULAR | Status: AC
  Filled 2020-09-15: qty 10

## 2020-09-15 MED ORDER — PHENYLEPHRINE HCL (PRESSORS) 10 MG/ML IV SOLN
INTRAVENOUS | Status: DC | PRN
  Administered 2020-09-15: 120 ug via INTRAVENOUS
  Administered 2020-09-15: 200 ug via INTRAVENOUS

## 2020-09-15 SURGICAL SUPPLY — 42 items
BENZOIN TINCTURE PRP APPL 2/3 (GAUZE/BANDAGES/DRESSINGS) ×2 IMPLANT
CHLORAPREP W/TINT 26 (MISCELLANEOUS) ×2 IMPLANT
CLAMP CORD UMBIL (MISCELLANEOUS) IMPLANT
CLOTH BEACON ORANGE TIMEOUT ST (SAFETY) ×2 IMPLANT
DRAPE C SECTION CLR SCREEN (DRAPES) ×2 IMPLANT
DRSG OPSITE POSTOP 4X10 (GAUZE/BANDAGES/DRESSINGS) ×2 IMPLANT
DRSG PAD ABDOMINAL 8X10 ST (GAUZE/BANDAGES/DRESSINGS) ×2 IMPLANT
ELECT REM PT RETURN 9FT ADLT (ELECTROSURGICAL) ×4
ELECTRODE REM PT RTRN 9FT ADLT (ELECTROSURGICAL) ×2 IMPLANT
EXTRACTOR VACUUM KIWI (MISCELLANEOUS) IMPLANT
GAUZE SPONGE 4X4 12PLY STRL LF (GAUZE/BANDAGES/DRESSINGS) ×4 IMPLANT
GLOVE BIO SURGEON STRL SZ7 (GLOVE) ×2 IMPLANT
GLOVE BIOGEL PI IND STRL 7.0 (GLOVE) ×2 IMPLANT
GLOVE BIOGEL PI INDICATOR 7.0 (GLOVE) ×2
GLOVE SURG POLYISO LF SZ6.5 (GLOVE) ×2 IMPLANT
GOWN STRL REUS W/ TWL LRG LVL3 (GOWN DISPOSABLE) ×3 IMPLANT
GOWN STRL REUS W/TWL LRG LVL3 (GOWN DISPOSABLE) ×3
HEMOSTAT ARISTA ABSORB 3G PWDR (HEMOSTASIS) ×4 IMPLANT
KIT ABG SYR 3ML LUER SLIP (SYRINGE) IMPLANT
NEEDLE HYPO 25X5/8 SAFETYGLIDE (NEEDLE) IMPLANT
NS IRRIG 1000ML POUR BTL (IV SOLUTION) ×2 IMPLANT
PACK C SECTION WH (CUSTOM PROCEDURE TRAY) ×2 IMPLANT
PAD ABD 7.5X8 STRL (GAUZE/BANDAGES/DRESSINGS) ×2 IMPLANT
PAD OB MATERNITY 4.3X12.25 (PERSONAL CARE ITEMS) ×2 IMPLANT
PENCIL SMOKE EVAC W/HOLSTER (ELECTROSURGICAL) ×2 IMPLANT
RTRCTR C-SECT PINK 25CM LRG (MISCELLANEOUS) ×2 IMPLANT
SPONGE GAUZE 4X4 12PLY (GAUZE/BANDAGES/DRESSINGS) ×4 IMPLANT
SPONGE LAP 18X18 X RAY DECT (DISPOSABLE) ×6 IMPLANT
STRIP CLOSURE SKIN 1/2X4 (GAUZE/BANDAGES/DRESSINGS) ×2 IMPLANT
STRIP SURGICAL 1 X 6 IN (GAUZE/BANDAGES/DRESSINGS) ×2 IMPLANT
SUT MNCRL 0 VIOLET CTX 36 (SUTURE) ×4 IMPLANT
SUT MNCRL+ AB 3-0 CT1 36 (SUTURE) ×4 IMPLANT
SUT MONOCRYL 0 CTX 36 (SUTURE) ×4
SUT MONOCRYL AB 3-0 CT1 36IN (SUTURE) ×4
SUT PDS AB 0 CTX 36 PDP370T (SUTURE) ×4 IMPLANT
SUT PLAIN 0 NONE (SUTURE) IMPLANT
SUT VIC AB 2-0 CT1 27 (SUTURE)
SUT VIC AB 2-0 CT1 TAPERPNT 27 (SUTURE) IMPLANT
SUT VIC AB 4-0 KS 27 (SUTURE) ×2 IMPLANT
TOWEL OR 17X24 6PK STRL BLUE (TOWEL DISPOSABLE) ×4 IMPLANT
TRAY FOLEY W/BAG SLVR 14FR LF (SET/KITS/TRAYS/PACK) ×2 IMPLANT
WATER STERILE IRR 1000ML POUR (IV SOLUTION) ×2 IMPLANT

## 2020-09-15 NOTE — Progress Notes (Signed)
   Subjective: Patient is comfortable with her epidural. She is without complaints.   Objective: BP 108/68   Pulse 98   Temp 99.3 F (37.4 C) (Oral)   Resp 20   Ht 5\' 2"  (1.575 m)   Wt 85 kg   SpO2 100%   BMI 34.28 kg/m  I/O last 3 completed shifts: In: -  Out: 1000 [Urine:1000] Total I/O In: -  Out: 1525 [Urine:1525]  FHT:  Baseline 150 moderate variability . accelerations present... decelerations absent.  UC:   irregular, every 3-6 minutes .. mvu 90 on 18 mU of pitocin  SVE:   Dilation: 8 Effacement (%): 90 Station: 0 Exam by:: Dr. [email or web address]  Labs: Lab Results  Component Value Date   WBC 6.3 09/14/2020   HGB 12.6 09/14/2020   HCT 37.4 09/14/2020   MCV 89.5 09/14/2020   PLT 155 09/14/2020    Assessment / Plan: Protracted active phase  Labor: continue pitocin to achieve mvu of 200  Preeclampsia:  NA Fetal Wellbeing:  Category I Pain Control:  Epidural I/D:  n/a Anticipated MOD:  NSVD  11/14/2020 09/15/2020, 6:53 AM

## 2020-09-15 NOTE — Op Note (Signed)
Pre Op Dx:   1. Single live IUP at [redacted]w[redacted]d 2. Failure to progress 3. Non-reassuring fetal heart tracing  Post Op Dx:  1. Single live IUP at [redacted]w[redacted]d 2. Failure to progress 3. Non-reassuring fetal heart tracing 4. Bandl's ring  Procedure:   Low Transverse Cesarean Section  Surgeon:  Dr. Steva Ready Assistants:  Rhea Pink, CNM Anesthesia:  Epidural  EBL:  1500cc  IVF:  2500cc UOP:  900cc  Drains:  Foley catheter Specimen removed:  Placenta - sent to pathology and umbilical artery gases Device(s) implanted:  None Case Type:  Clean-contaminated Findings: Normal-appearing uterus, bilateral fallopian tubes, and ovaries. Fetus in cephalic position but noted to be direct OP. Clear amniotic fluid. Bandl's ring noted. Complications: None Indications:  35 y.o. [id number] at [redacted]w[redacted]d admitted for elective induction of labor and failed to progress - remained 8cm for > 6 hours with inadequate contractions and non-reassuring fetal heart tracing (minimal variability with recurrent variable decelerations). Procedure:  After informed consent was obtained, the patient was brought to the operating room.  Following confirmation of adequate epidural anesthesia, the patient was positioned in dorsal supine position with a leftward tilt and was prepped and draped in sterile fashion.  A preoperative time-out was performed.  The abdomen was entered in layers through a pfannenstiel incision and an Teacher, early years/pre was placed. The findings as documented above were noted. A low transverse hysterotomy was created sharply to the level of the membranes, then extended bluntly.  The fetus was delivered from cephalic (direct occiput posterior) presentation onto the field.  Bulb suctioning was performed.  The cord was doubly clamped and cut after a 60 second pause.  The newborn was passed to the warmer.  The placenta was delivered. Due to uterine atony, Methergine was administered. The uterus was swept free of clots and debris  and closed in a running locked fashion with 0-Monocryl. A second imbricating layer was used to close the uterus using 0-Monocryl. Additional hemostasis achieved using figure-of-eight sutures along the hysterotomy.  Hemostasis was verified.  The abdomen was irrigated with warmed saline and cleared of clots.  The peritoneum was closed in a running fashion with 3-0 Monocryl.  Subfascial spaces were inspected and hemostasis assured.  The fascia was closed in a running fashion with 0-PDS.  The subcutaneous tissues were irrigated and hemostasis assured.  The subcutaneous tissues were closed with 3-0 Monocryl.  The skin was closed with 4-0 Vicryl.  A sterile bandage was applied.  The patient was transferred to PACU.  All needle, sponge, and instrument counts were correct at the end of the case.   Disposition:  PACU  I performed the procedure and the assistant was needed due to the complexity of the anatomy.  Steva Ready, DO

## 2020-09-15 NOTE — Progress Notes (Signed)
In to assess patient due to report of recurrent late decelerations.   Subjective: Patient is comfortable with her epidural. Continued LOF no vaginal bleeding   Objective: BP (!) 106/58   Pulse 93   Temp 99.3 F (37.4 C) (Oral)   Resp 20   Ht 5\' 2"  (1.575 m)   Wt 85 kg   SpO2 100%   BMI 34.28 kg/m  I/O last 3 completed shifts: In: -  Out: 1000 [Urine:1000] Total I/O In: -  Out: 1125 [Urine:1125]  FHT:  FHR: 140 bpm, variability: moderate,  accelerations:  Present,  decelerations:  Present late decelerations  UC:   regular, every 2-3 minutes mvu 140 SVE:   Dilation: 8 Effacement (%): 90 Station: 0 Exam by:: Dr. [email or web address] positive scalp stimulation during cervical exam   Labs: Lab Results  Component Value Date   WBC 6.3 09/14/2020   HGB 12.6 09/14/2020   HCT 37.4 09/14/2020   MCV 89.5 09/14/2020   PLT 155 09/14/2020    Assessment / Plan: Protracted active phase  Labor:  will hold  pitocin for 30 minutes and reposition patient.  Preeclampsia:  NA Fetal Wellbeing:  Category II resolved with position change and discontinuation of pitocin  Pain Control:  Epidural I/D:  n/a Anticipated MOD:  NSVD   Benadryl 25 mg IV given due to cervical edema . Will monitor closely   11/14/2020 09/15/2020, 4:14 AM

## 2020-09-15 NOTE — Transfer of Care (Signed)
Immediate Anesthesia Transfer of Care Note  Patient: Uri Covey  Procedure(s) Performed: CESAREAN SECTION  Patient Location: PACU  Anesthesia Type:Epidural  Level of Consciousness: awake, alert , oriented and patient cooperative  Airway & Oxygen Therapy: Patient Spontanous Breathing  Post-op Assessment: Report given to RN and Post -op Vital signs reviewed and stable  Post vital signs: Reviewed and stable  Last Vitals:  Vitals Value Taken Time  BP    Temp    Pulse 75 09/15/20 1258  Resp 18 09/15/20 1258  SpO2 99 % 09/15/20 1258  Vitals shown include unvalidated device data.  Last Pain:  Vitals:   09/15/20 0816  TempSrc: Oral  PainSc:          Complications: No complications documented.

## 2020-09-15 NOTE — Progress Notes (Signed)
OB Progress Note  S: Patient resting comfortably with epidural. Not feeling any pressure.  O: BP 100/62   Pulse 95   Temp 99.3 F (37.4 C) (Oral)   Resp 20   Ht 5\' 2"  (1.575 m)   Wt 85 kg   SpO2 100%   BMI 34.28 kg/m   FHT: 150bpm, minimal variablity, - accels, - decels, positive scalp stim with improvement in variability with cervical exam Toco: q3 minutes SVE: 8/80/0, cervix feels mildly edematous but stretches, fetal head palpates OP - attempted manual rotation  A/P: 35 y.o. 20 @ [redacted]w[redacted]d admitted for elective induction of labor.  FWB: Cat. II initially but responds to scalp stim Labor course: Pitocin at 32mU/min - continue to titrate as tolerated, will place in throne again position to aid with fetal descent Pain: Comfortable with epidural GBS: Negative Anticipate SVD  38m, DO [phone] (office)

## 2020-09-15 NOTE — Progress Notes (Signed)
OB Progress Note  In to evaluate for cervical change. Patient resting comfortably with epidural. Cervix 8/80/0, fetal head position feels OP. Significant edema of the vulva and cervix noted. FHR baseline 145bpm with minimal variability, recurrent variable decelerations, no accelerations. Contractions every 2.5-3 minutes on toco and are not adequate. Pitocin at 54mU/min.  Cervix unchanged since at least 0231 this morning with inadequate contractions - meets criteria for failure to progress. We also discussed concern regarding fetal heart racing as variability is minimal and now with variable decelerations that are recurrent. Recommend cesarean delivery - patient in agreement. L&D staff, OR charge, and anesthesia aware.  I have explained to the patient that this surgery is performed to deliver their baby or babies through an incision in the abdomen and incision in the uterus.  Prior to surgery, the risks and benefits of the surgery, as well as alternative treatments were discussed.  The risks include, but are not limited to, possible need for cesarean delivery for all future pregnancies, bleeding at the time of surgery that could necessitate a blood transfusion and/or hysterectomy, rupture of the uterus during a future pregnancy that could cause a preterm delivery and/or requiring hysterectomy, infection, damage to surrounding organs and tissues, damage to bladder, damage to ureters, causing kidney damage, and requiring additional procedures, damage to bowels, resulting in further surgery, postoperative pain, short-term and long-term, scarring on the abdominal wall and intra-abdominally, need for further surgery, development of an incisional hernia, deep vein thrombosis and/or pulmonary embolism, wound infection and/or separation, painful intercourse, urinary leakage, impact on future pregnancies including but not limited to, abnormal location or attachment of the placenta to the uterus, such as placenta previa  or accreta, that may necessitate a blood transfusion and/or hysterectomy, impact on total family size, complications the course of which cannot be predicted or prevented, and death. Patient was consented for blood products.  The patient is aware that bleeding may result in the need for a blood transfusion which includes risk of transmission of HIV (1:2 million), Hepatitis C (1:2 million), and Hepatitis B (1:200 thousand) and transfusion reaction.  Patient voiced understanding of the above risks as well as understanding of indications for blood transfusion.  Steva Ready, DO

## 2020-09-16 LAB — CBC
HCT: 30.9 % — ABNORMAL LOW (ref 36.0–46.0)
Hemoglobin: 10.5 g/dL — ABNORMAL LOW (ref 12.0–15.0)
MCH: 30.8 pg (ref 26.0–34.0)
MCHC: 34 g/dL (ref 30.0–36.0)
MCV: 90.6 fL (ref 80.0–100.0)
Platelets: 160 10*3/uL (ref 150–400)
RBC: 3.41 MIL/uL — ABNORMAL LOW (ref 3.87–5.11)
RDW: 15.2 % (ref 11.5–15.5)
WBC: 20.2 10*3/uL — ABNORMAL HIGH (ref 4.0–10.5)
nRBC: 0 % (ref 0.0–0.2)

## 2020-09-16 NOTE — Anesthesia Postprocedure Evaluation (Signed)
Anesthesia Post Note  Patient: Emma Baxter  Procedure(s) Performed: CESAREAN SECTION     Patient location during evaluation: PACU Anesthesia Type: Epidural Level of consciousness: oriented and awake and alert Pain management: pain level controlled Vital Signs Assessment: post-procedure vital signs reviewed and stable Respiratory status: spontaneous breathing, respiratory function stable and nonlabored ventilation Cardiovascular status: blood pressure returned to baseline and stable Postop Assessment: no headache, no backache, no apparent nausea or vomiting and epidural receding Anesthetic complications: no   No complications documented.  Last Vitals:  Vitals:   09/16/20 0057 09/16/20 0422  BP: 96/72 95/67  Pulse: 79 79  Resp: 18 18  Temp: (!) 36.1 C 36.9 C  SpO2: 99% 100%    Last Pain:  Vitals:   09/16/20 0422  TempSrc: Oral  PainSc:    Pain Goal:                   Lucretia Kern

## 2020-09-16 NOTE — Plan of Care (Signed)
  Problem: Education: Goal: Knowledge of condition will improve Outcome: Completed/Met   Problem: Activity: Goal: Will verbalize the importance of balancing activity with adequate rest periods Outcome: Completed/Met   Problem: Life Cycle: Goal: Chance of risk for complications during the postpartum period will decrease Outcome: Completed/Met

## 2020-09-16 NOTE — Progress Notes (Signed)
Postpartum Note Day #1  S:  Patient doing well.  Pain controlled.  Tolerating regular diet.   Ambulating and voiding without difficulty. She would like to go home tomorrow as long as infant is discharged.  She is surprised at how well she feels after CS. Denies fevers, chills, chest pain, SOB, N/V, or worsening bilateral LE edema.  Lochia: Minimal Infant feeding:  Breast Circumcision:  Desires prior to discharge Contraception:  Vasectomy  O: Temp:  [97 F (36.1 C)-98.7 F (37.1 C)] 98.5 F (36.9 C) (06/04 0422) Pulse Rate:  [71-87] 79 (06/04 0422) Resp:  [17-20] 18 (06/04 0422) BP: (95-137)/(64-96) 95/67 (06/04 0422) SpO2:  [94 %-100 %] 100 % (06/04 0422) Gen: NAD, pleasant and cooperative CV: RRR Resp: CTAB, no wheezes/rales/rhonchi Abdomen: soft, non-distended, non-tender throughout Uterus: firm, non-tender, below umbilicus Incision: pressure dressing removed due to some saturation, honeycomb dressing beneath saturated  Ext: Trace bilateral LE edema, no bilateral calf tenderness, SCDs on and working  Labs: Recent Labs    09/14/20 0750 09/16/20 0429  HGB 12.6 10.5*    A/P: Patient is a 35 y.o. [id number] POD#1 s/p LTCS.  - Pain well controlled  - GU: UOP is adequate - GI: Tolerating regular diet - Dressing: Primary RN to change honeycomb dressing - Activity: encouraged sitting up to chair and ambulation as tolerated - DVT Prophylaxis: SCDs in bed, early ambulation - Labs: stable as above  Circumcision consent: Routine circumcisions performed on newborns have been identified as voluntary, elective procedures by [employer] such as the [employer].  It is considered an elective procedure with no definitive medical indication and carries risks.  Risks include but are not limited to bleeding, infection, damage to penis with possible need for further surgery, poor cosmesis, and local anesthetic risks.  Circumcision will only be performed if  patient is deemed to have normal anatomy by his Pediatrician, meets adequate criteria for a newborn of similar gestational age after birth and is without infection or other medical issue contraindicating an elective procedure.   Patient understands and agrees with above consent Patient discussed with mother of intant   Disposition:  Likely POD#2-3.   Steva Ready, DO [phone] (office)

## 2020-09-17 MED ORDER — OXYCODONE HCL 5 MG PO TABS: 5.0000 mg | ORAL_TABLET | ORAL | 0 refills | Status: AC | PRN

## 2020-09-17 MED ORDER — IBUPROFEN 800 MG PO TABS: 800.0000 mg | ORAL_TABLET | Freq: Three times a day (TID) | ORAL | 1 refills | Status: AC | PRN

## 2020-09-17 NOTE — Discharge Summary (Signed)
Postpartum Discharge Summary  Date of Service: 09/17/20      Patient Name: Emma Baxter DOB: 30-Mar-1986 MRN: [id number]  Date of admission: 09/14/2020 Delivery date:09/15/2020  Delivering provider: Drema Dallas  Date of discharge: 09/17/2020  Admitting diagnosis: Term pregnancy [Z34.90] Intrauterine pregnancy: [redacted]w[redacted]d    Secondary diagnosis:  Active Problems:   Term pregnancy  Additional problems: None    Discharge diagnosis: Term Pregnancy Delivered                                              Post partum procedures:None Augmentation: AROM and Pitocin Complications: None  Hospital course: Induction of Labor With Cesarean Section   35y.o. yo [id number] admitted to the hospital 09/14/2020 for induction of labor. Patient had a labor course significant for unstable fetal lie. Upon admission, fetus was breech but spontaneously turned to cephalic position prior to induction of labor. The patient went for cesarean section due to Failure to progress. Delivery details are as follows: Membrane Rupture Time/Date: 4:04 PM ,09/14/2020   Delivery Method:C-Section, Low Vertical  Details of operation can be found in separate operative Note.  Patient had an uncomplicated postpartum course. She is ambulating, tolerating a regular diet, passing flatus, and urinating well.  Patient is discharged home in stable condition on 09/17/20.      Newborn Data: Birth date:09/15/2020  Birth time:11:50 AM  Gender:Female  Living status:Living  Apgars:8 ,9  Weight:3504 g                                 Magnesium Sulfate received: No BMZ received: No Rhophylac:N/A MMR:N/A T-DaP:Declined Flu: No Transfusion:No  Physical exam  Vitals:   09/16/20 0422 09/16/20 1430 09/16/20 2014 09/17/20 0621  BP: 95/67 (!) 97/55 104/60 (!) 89/58  Pulse: 79 81 89 92  Resp: 18 18 16 18   Temp: 98.5 F (36.9 C) 97.9 F (36.6 C) 98 F (36.7 C) 98.2 F (36.8 C)  TempSrc: Oral Oral Oral Oral  SpO2: 100%  100%  100%  Weight:      Height:       General: alert, cooperative and no distress  Cardio: RRR Lungs: CTAB, no wheezes/rales/rhonchi Lochia: appropriate Uterine Fundus: firm Incision: Healing well with no significant drainage, Dressing is clean, dry, and intact DVT Evaluation: No evidence of DVT seen on physical exam. Labs: Lab Results  Component Value Date   WBC 20.2 (H) 09/16/2020   HGB 10.5 (L) 09/16/2020   HCT 30.9 (L) 09/16/2020   MCV 90.6 09/16/2020   PLT 160 09/16/2020   No flowsheet data found. Edinburgh Score: No flowsheet data found.    After visit meds:  Allergies as of 09/17/2020   No Known Allergies     Medication List    TAKE these medications   ibuprofen 800 MG tablet Commonly known as: ADVIL Take 1 tablet (800 mg total) by mouth every 8 (eight) hours as needed.   oxyCODONE 5 MG immediate release tablet Commonly known as: Oxy IR/ROXICODONE Take 1-2 tablets (5-10 mg total) by mouth every 4 (four) hours as needed for moderate pain or severe pain.        Discharge home in stable condition Infant Feeding: Breast Infant Disposition:home with mother Discharge instruction: per After Visit Summary and Postpartum booklet. Activity: Advance as  tolerated. Pelvic rest for 6 weeks.  Diet: routine diet Anticipated Birth Control: Husband plans on vasectomy Postpartum Appointment:6 weeks Additional Postpartum F/U: Incision check 2 weeks Future Appointments:No future appointments. Follow up Visit:  Follow-up Information    Christophe Louis, MD Follow up in 2 week(s).   Specialty: Obstetrics and Gynecology Why: Our office will arrange a 2 week post-operative incision check and then your 6 week postpartum visit. Contact information: [address] [place] [address] [place] [address] [phone]                   09/17/2020 Drema Dallas, DO

## 2020-09-17 NOTE — Plan of Care (Signed)
  Problem: Life Cycle: Goal: Ability to make normal progression through stages of labor will improve Outcome: Completed/Met   Problem: Role Relationship: Goal: Will demonstrate positive interactions with the child Outcome: Completed/Met   Problem: Safety: Goal: Risk of complications during labor and delivery will decrease Outcome: Completed/Met   Problem: Pain Management: Goal: Relief or control of pain from uterine contractions will improve Outcome: Completed/Met   Problem: Education: Goal: Knowledge of General Education information will improve Description: Including pain rating scale, medication(s)/side effects and non-pharmacologic comfort measures Outcome: Completed/Met   Problem: Clinical Measurements: Goal: Ability to maintain clinical measurements within normal limits will improve Outcome: Completed/Met   Problem: Elimination: Goal: Will not experience complications related to urinary retention Outcome: Completed/Met   Problem: Pain Managment: Goal: General experience of comfort will improve Outcome: Completed/Met   Problem: Safety: Goal: Ability to remain free from injury will improve Outcome: Completed/Met   Problem: Activity: Goal: Ability to tolerate increased activity will improve Outcome: Completed/Met   Problem: Role Relationship: Goal: Ability to demonstrate positive interaction with newborn will improve Outcome: Completed/Met

## 2020-09-19 LAB — SURGICAL PATHOLOGY

## 2022-08-13 ENCOUNTER — Other Ambulatory Visit (HOSPITAL_COMMUNITY)
Admission: RE | Admit: 2022-08-13 | Discharge: 2022-08-13 | Disposition: A | Discharge status: Home / Self Care | Payer: 59 | Source: Ambulatory Visit | Attending: Obstetrics and Gynecology | Admitting: Obstetrics and Gynecology

## 2022-08-13 DIAGNOSIS — Z01419 Encounter for gynecological examination (general) (routine) without abnormal findings: Secondary | ICD-10-CM | POA: Insufficient documentation

## 2022-08-16 LAB — CYTOLOGY - PAP
Comment: NEGATIVE
Diagnosis: NEGATIVE
Diagnosis: REACTIVE
High risk HPV: NEGATIVE
